# Patient Record
Sex: Male | Born: 1990 | Race: Asian | Hispanic: No | Marital: Married | Smoking: Never smoker
Health system: Southern US, Community
[De-identification: ages and names within clinical notes are randomized; demographics above are authoritative.]

---

## 2018-02-25 ENCOUNTER — Emergency Department (HOSPITAL_COMMUNITY)
Admission: EM | Admit: 2018-02-25 | Discharge: 2018-02-25 | Disposition: A | Discharge status: Home / Self Care | Payer: 59 | Attending: Emergency Medicine | Admitting: Emergency Medicine

## 2018-02-25 ENCOUNTER — Other Ambulatory Visit: Payer: Self-pay

## 2018-02-25 ENCOUNTER — Emergency Department (HOSPITAL_COMMUNITY): Payer: 59

## 2018-02-25 DIAGNOSIS — M5442 Lumbago with sciatica, left side: Secondary | ICD-10-CM

## 2018-02-25 DIAGNOSIS — M545 Low back pain: Secondary | ICD-10-CM | POA: Diagnosis present

## 2018-02-25 MED ORDER — METHOCARBAMOL 500 MG PO TABS: 500.0000 mg | ORAL_TABLET | Freq: Two times a day (BID) | ORAL | 0 refills | Status: DC

## 2018-02-25 MED ORDER — CYCLOBENZAPRINE HCL 10 MG PO TABS
10.0000 mg | ORAL_TABLET | Freq: Once | ORAL | Status: AC
  Administered 2018-02-25: 10 mg via ORAL
  Filled 2018-02-25: qty 1

## 2018-02-25 MED ORDER — ACETAMINOPHEN 500 MG PO TABS
1000.0000 mg | ORAL_TABLET | Freq: Once | ORAL | Status: AC
  Administered 2018-02-25: 1000 mg via ORAL
  Filled 2018-02-25: qty 2

## 2018-02-25 NOTE — ED Provider Notes (Signed)
Patient placed in Quick Look pathway, seen and evaluated   Chief Complaint: Back pain  HPI:   27 year old male presents with lower back pain radiating to the left leg starting approximately 2 hours ago.  He states he was at lunch and he twisted a certain way and cause acute onset of the back pain.  The pain is severe and he feels like his leg is weak because of the pain.  The pain radiates to his knee.  He has had similar episodes before but never this severe.  He does not do heavy lifting. No fever, syncope, trauma, unexplained weight loss, hx of cancer, loss of bowel/bladder function, saddle anesthesia, urinary retention, IVDU.  ROS: +back pain  -fever  Physical Exam:   Gen: No distress  Neuro: Awake and Alert  Skin: Warm    Focused Exam: Back: No masses, deformity, or rash   Palpation: No midline spinal tenderness. No paraspinal muscle tenderness.   Strength: 5/5 in lower extremities and normal plantar and dorsiflexion   Sensation: Intact sensation with light touch in lower extremities bilaterally   Gait: Antalgic gait   Plan: Pain control, lumbar xray  Initiation of care has begun. The patient has been counseled on the process, plan, and necessity for staying for the completion/evaluation, and the remainder of the medical screening examination    Bethel Born, PA-C 02/25/18 1416    Gerhard Munch, MD 02/26/18 2046

## 2018-02-25 NOTE — Discharge Instructions (Addendum)
Please read attached information. If you experience any new or worsening signs or symptoms please return to the emergency room for evaluation. Please follow-up with your primary care provider or specialist as discussed. Please use medication prescribed only as directed and discontinue taking if you have any concerning signs or symptoms.   °

## 2018-02-25 NOTE — ED Triage Notes (Signed)
Pt states for about a month has had mild symptoms of lower back pain. Today he has lower back pain with sever shooting pain into left leg.

## 2018-02-27 NOTE — ED Provider Notes (Signed)
[facility] EMERGENCY DEPARTMENT Provider Note   CSN: [id number] Arrival date & time: 02/25/18  1332     History   Chief Complaint Chief Complaint  Patient presents with  . Back Pain  . Leg Pain    HPI Satoshi Kalas is a 27 y.o. male.  HPI   27 year old male presents today with complaints of low back pain.  Patient reports he was getting out of his car when he made a movement causing excruciating left lower back pain radiating down his left leg.  Denies any loss of distal sensation strength and motor function, notes ambulation does hurt his back.,  Denies any red flags.  Denies any history of the same.   No past medical history on file.  There are no active problems to display for this patient.      Home Medications    Prior to Admission medications   Medication Sig Start Date End Date Taking? Authorizing Provider  methocarbamol (ROBAXIN) 500 MG tablet Take 1 tablet (500 mg total) by mouth 2 (two) times daily. 02/25/18   Eyvonne Mechanic, PA-C    Family History No family history on file.  Social History Social History   Tobacco Use  . Smoking status: Not on file  Substance Use Topics  . Alcohol use: Not on file  . Drug use: Not on file     Allergies   Patient has no known allergies.   Review of Systems Review of Systems  All other systems reviewed and are negative.  Physical Exam Updated Vital Signs BP (!) 129/54 (BP Location: Right Arm)   Pulse 61   Temp 98.1 F (36.7 C) (Oral)   Resp 20   SpO2 100%   Physical Exam  Constitutional: He is oriented to person, place, and time. He appears well-developed and well-nourished.  HENT:  Head: Normocephalic and atraumatic.  Eyes: Pupils are equal, round, and reactive to light. Conjunctivae are normal. Right eye exhibits no discharge. Left eye exhibits no discharge. No scleral icterus.  Neck: Normal range of motion. No JVD present. No tracheal deviation present.  Pulmonary/Chest: Effort  normal. No stridor.  Musculoskeletal:  No significant CT or L-spine tenderness palpation, no tenderness palpation of the musculature, straight leg negative distal sensation strength and motor function intact  Neurological: He is alert and oriented to person, place, and time. Coordination normal.  Psychiatric: He has a normal mood and affect. His behavior is normal. Judgment and thought content normal.  Nursing note and vitals reviewed.    ED Treatments / Results  Labs (all labs ordered are listed, but only abnormal results are displayed) Labs Reviewed - No data to display  EKG None  Radiology No results found.  Procedures Procedures (including critical care time)  Medications Ordered in ED Medications  acetaminophen (TYLENOL) tablet 1,000 mg (1,000 mg Oral Given 02/25/18 1422)  cyclobenzaprine (FLEXERIL) tablet 10 mg (10 mg Oral Given 02/25/18 1422)     Initial Impression / Assessment and Plan / ED Course  I have reviewed the triage vital signs and the nursing notes.  Pertinent labs & imaging results that were available during my care of the patient were reviewed by me and considered in my medical decision making (see chart for details).      Final Clinical Impressions(s) / ED Diagnoses   Final diagnoses:  Acute left-sided low back pain with left-sided sciatica   Labs:   Imaging: DG lumbar spine complete   Consults:  Therapeutics:  Discharge Meds:  Robaxin  Assessment/Plan: 27 year old male presents today with unconjugated back pain.  No neurological deficits, no acute findings on plain films.  Symptomatic care instructions given, strict return precautions given.  He verbalized understanding and agreement to today's plan had no further questions or concerns at time of discharge.    ED Discharge Orders        Ordered    methocarbamol (ROBAXIN) 500 MG tablet  2 times daily     02/25/18 1754       Rosalio Loud 02/27/18 1935    Wynetta Fines,  MD 03/05/18 [phone]

## 2020-02-29 ENCOUNTER — Ambulatory Visit: Payer: 59 | Attending: Internal Medicine

## 2020-02-29 DIAGNOSIS — Z20822 Contact with and (suspected) exposure to covid-19: Secondary | ICD-10-CM

## 2020-03-01 LAB — NOVEL CORONAVIRUS, NAA: SARS-CoV-2, NAA: NOT DETECTED

## 2020-03-01 LAB — SARS-COV-2, NAA 2 DAY TAT

## 2021-06-05 ENCOUNTER — Encounter (HOSPITAL_COMMUNITY): Payer: Self-pay

## 2021-06-05 ENCOUNTER — Ambulatory Visit (HOSPITAL_COMMUNITY)
Admission: EM | Admit: 2021-06-05 | Discharge: 2021-06-05 | Disposition: A | Discharge status: Home / Self Care | Payer: 59 | Attending: Physician Assistant | Admitting: Physician Assistant

## 2021-06-05 ENCOUNTER — Other Ambulatory Visit: Payer: Self-pay

## 2021-06-05 ENCOUNTER — Ambulatory Visit (INDEPENDENT_AMBULATORY_CARE_PROVIDER_SITE_OTHER): Payer: 59

## 2021-06-05 DIAGNOSIS — M25572 Pain in left ankle and joints of left foot: Secondary | ICD-10-CM

## 2021-06-05 MED ORDER — PREDNISONE 20 MG PO TABS: 40.0000 mg | ORAL_TABLET | Freq: Every day | ORAL | 0 refills | Status: AC

## 2021-06-05 NOTE — ED Provider Notes (Signed)
[facility]    CSN: [id number] Arrival date & time: 06/05/21  1244      History   Chief Complaint Chief Complaint  Patient presents with   Ankle Pain    HPI Walter Lozano is a 30 y.o. male.   Patient presents today with a 2-day history of worsening left medial ankle pain.  Reports that prior to symptom onset he did go hiking but denies any specific injury prior to symptom onset.  He reports pain is gradually been worsening and is currently rated 4 at rest but increases to 10 with attempted ambulation or movement of tenderness, described as sharp, no leaving factors identified.  He has not tried any over-the-counter medications for symptom management but has been using a brace which was provided no relief of symptoms.  He does report some numbness due to swelling but denies any paresthesias or inability to bear weight.  Denies previous injury or surgery involving ankle.  Denies any recent change to footwear or shoes prior to symptom onset.   History reviewed. No pertinent past medical history.  There are no problems to display for this patient.   History reviewed. No pertinent surgical history.     Home Medications    Prior to Admission medications   Medication Sig Start Date End Date Taking? Authorizing Provider  predniSONE (DELTASONE) 20 MG tablet Take 2 tablets (40 mg total) by mouth daily for 5 days. 06/05/21 06/10/21 Yes Wylodean Shimmel, Noberto Retort, PA-C  methocarbamol (ROBAXIN) 500 MG tablet Take 1 tablet (500 mg total) by mouth 2 (two) times daily. 02/25/18   Eyvonne Mechanic, PA-C    Family History History reviewed. No pertinent family history.  Social History Social History   Tobacco Use   Smoking status: Never   Smokeless tobacco: Never  Vaping Use   Vaping Use: Never used  Substance Use Topics   Drug use: Never     Allergies   Patient has no known allergies.   Review of Systems Review of Systems  Constitutional:  Positive for activity change. Negative for  appetite change, fatigue and fever.  Respiratory:  Negative for cough and shortness of breath.   Cardiovascular:  Negative for chest pain.  Gastrointestinal:  Negative for abdominal pain, diarrhea, nausea and vomiting.  Musculoskeletal:  Positive for arthralgias, gait problem and joint swelling. Negative for myalgias.  Neurological:  Negative for dizziness, light-headedness and headaches.    Physical Exam Triage Vital Signs ED Triage Vitals  Enc Vitals Group     BP 06/05/21 1335 123/86     Pulse Rate 06/05/21 1335 81     Resp 06/05/21 1335 18     Temp 06/05/21 1334 98 F (36.7 C)     Temp Source 06/05/21 1334 Oral     SpO2 06/05/21 1335 98 %     Weight --      Height --      Head Circumference --      Peak Flow --      Pain Score 06/05/21 1333 10     Pain Loc --      Pain Edu? --      Excl. in GC? --    No data found.  Updated Vital Signs BP 123/86 (BP Location: Right Arm)   Pulse 81   Temp 98 F (36.7 C) (Oral)   Resp 18   SpO2 98%   Visual Acuity Right Eye Distance:   Left Eye Distance:   Bilateral Distance:    Right  Eye Near:   Left Eye Near:    Bilateral Near:     Physical Exam Vitals reviewed.  Constitutional:      General: He is awake.     Appearance: Normal appearance. He is normal weight. He is not ill-appearing.     Comments: Very pleasant male appears stated age no acute distress sitting comfortably in wheelchair  HENT:     Head: Normocephalic and atraumatic.  Cardiovascular:     Rate and Rhythm: Normal rate and regular rhythm.     Pulses:          Posterior tibial pulses are 2+ on the right side and 2+ on the left side.     Heart sounds: Normal heart sounds, S1 normal and S2 normal. No murmur heard.    Comments: Capillary refill within 2 seconds bilateral toes Pulmonary:     Effort: Pulmonary effort is normal.     Breath sounds: Normal breath sounds. No stridor. No wheezing, rhonchi or rales.     Comments: Clear to auscultation  bilaterally Musculoskeletal:     Left ankle: No swelling, deformity or ecchymosis. Tenderness present over the medial malleolus. Decreased range of motion.     Left Achilles Tendon: No tenderness. Thompson's test negative.     Comments: Left ankle/foot: Decreased range of motion secondary to pain with dorsiflexion, plantarflexion, inversion, eversion.  Tenderness palpation over medial malleolus.  No deformity noted.  No tenderness palpation over Achilles tendon.  Foot neurovascularly intact.  Neurological:     Mental Status: He is alert.  Psychiatric:        Behavior: Behavior is cooperative.     UC Treatments / Results  Labs (all labs ordered are listed, but only abnormal results are displayed) Labs Reviewed - No data to display  EKG   Radiology DG Ankle Complete Left  Result Date: 06/05/2021 CLINICAL DATA:  Pain at the medial malleolus. EXAM: LEFT ANKLE COMPLETE - 3+ VIEW COMPARISON:  None. FINDINGS: There is no evidence of fracture, dislocation, or joint effusion. The ankle mortise is intact. There is no evidence of arthropathy or other focal bone abnormality. Soft tissues are unremarkable. IMPRESSION: Negative. Electronically Signed   By: Wiliam Ke M.D.   On: 06/05/2021 14:28    Procedures Procedures (including critical care time)  Medications Ordered in UC Medications - No data to display  Initial Impression / Assessment and Plan / UC Course  I have reviewed the triage vital signs and the nursing notes.  Pertinent labs & imaging results that were available during my care of the patient were reviewed by me and considered in my medical decision making (see chart for details).      X-ray obtained given tenderness over medial malleolus showed no acute osseous findings.  Patient already has an ankle brace and will continue using this regularly.  Recommended conservative treatment options including RICE protocol.  He was prescribed prednisone burst (40 mg x 5 days) with  instruction not to take NSAIDs with this medication due to risk of bleeding.  Can take Tylenol for breakthrough pain.  He was given contact information for local sports medicine provider and instructed to follow-up with them if symptoms or not improving within a few days with conservative treatment.  Discussed alarm symptoms that warrant emergent evaluation.  Strict return precautions given to which patient expressed understanding.  Final Clinical Impressions(s) / UC Diagnoses   Final diagnoses:  Acute left ankle pain     Discharge Instructions  Your x-ray was normal with no evidence of fracture.  We are going to use prednisone to help with pain and inflammation.  Take 2 tablets (40 mg) in the morning for 5 days.  Do not take NSAIDs including aspirin, ibuprofen/Advil, naproxen/Aleve with this medication as it can cause stomach bleeding.  You can take Tylenol for breakthrough pain.  Continue with RICE protocol as we discussed including rest, ice, compression, elevation.  Continue using your brace for comfort and compression.  If your symptoms or not improving please follow-up with sports medicine as we discussed.     ED Prescriptions     Medication Sig Dispense Auth. Provider   predniSONE (DELTASONE) 20 MG tablet Take 2 tablets (40 mg total) by mouth daily for 5 days. 10 tablet Jahleah Mariscal, Noberto Retort, PA-C      PDMP not reviewed this encounter.   Jeani Hawking, PA-C 06/05/21 1442

## 2021-06-05 NOTE — Discharge Instructions (Addendum)
Your x-ray was normal with no evidence of fracture.  We are going to use prednisone to help with pain and inflammation.  Take 2 tablets (40 mg) in the morning for 5 days.  Do not take NSAIDs including aspirin, ibuprofen/Advil, naproxen/Aleve with this medication as it can cause stomach bleeding.  You can take Tylenol for breakthrough pain.  Continue with RICE protocol as we discussed including rest, ice, compression, elevation.  Continue using your brace for comfort and compression.  If your symptoms or not improving please follow-up with sports medicine as we discussed.

## 2021-06-05 NOTE — ED Triage Notes (Signed)
Pt In with c/o left ankle pain after hiking 2 days ago  Denies any fall or injury  Pt is wearing brace and has some swelling

## 2021-10-04 ENCOUNTER — Other Ambulatory Visit: Payer: Self-pay

## 2021-10-04 ENCOUNTER — Encounter (HOSPITAL_COMMUNITY): Payer: Self-pay | Admitting: Emergency Medicine

## 2021-10-04 ENCOUNTER — Ambulatory Visit (HOSPITAL_COMMUNITY)
Admission: EM | Admit: 2021-10-04 | Discharge: 2021-10-04 | Disposition: A | Discharge status: Home / Self Care | Payer: 59 | Attending: Family Medicine | Admitting: Family Medicine

## 2021-10-04 DIAGNOSIS — U071 COVID-19: Secondary | ICD-10-CM

## 2021-10-04 MED ORDER — PROMETHAZINE-DM 6.25-15 MG/5ML PO SYRP: 5.0000 mL | ORAL_SOLUTION | Freq: Four times a day (QID) | ORAL | 0 refills | Status: AC | PRN

## 2021-10-04 NOTE — ED Triage Notes (Addendum)
Patient c/o headache, nasal congestion, and nonproductive cough x 2 days.   Patient endorses sore throat and generalized body aches.   Patient endorses having a positive at home COVID test (first positive test was on Monday).   Patient need note for work.   Patient has taken ibuprofen with relief of symptoms.

## 2021-10-05 NOTE — ED Provider Notes (Signed)
°  [facility]   [id number] 10/04/21 Arrival Time: 1756  ASSESSMENT & PLAN:  1. COVID-19 virus infection    Work note provided. OTC symptom care as needed.  Discharge Medication List as of 10/04/2021  7:33 PM     START taking these medications   Details  promethazine-dextromethorphan (PROMETHAZINE-DM) 6.25-15 MG/5ML syrup Take 5 mLs by mouth 4 (four) times daily as needed for cough., Starting Wed 10/04/2021, Normal         Follow-up Information      Urgent Care at [facility].   Specialty: Urgent Care Why: If worsening or failing to improve as anticipated. Contact information: [address] [place] [address] [phone]                Reviewed expectations re: course of current medical issues. Questions answered. Outlined signs and symptoms indicating need for more acute intervention. Understanding verbalized. After Visit Summary given.   SUBJECTIVE: History from: patient. Walter Lozano is a 30 y.o. male who reports: HA, nasal cong, non-prod cough; x 2 days. Home COVID test positive. Denies: difficulty breathing. Normal PO intake without n/v/d.  OBJECTIVE:  Vitals:   10/04/21 1924  BP: 120/70  Pulse: (!) 110  Resp: 16  Temp: 99.5 F (37.5 C)  TempSrc: Oral  SpO2: 97%    General appearance: alert; no distress Eyes: PERRLA; EOMI; conjunctiva normal HENT: [place]; AT; with nasal congestion Neck: supple  Lungs: speaks full sentences without difficulty; unlabored Extremities: no edema Skin: warm and dry Neurologic: normal gait Psychological: alert and cooperative; normal mood and affect   No Known Allergies  History reviewed. No pertinent past medical history. Social History   Socioeconomic History   Marital status: Married    Spouse name: Not on file   Number of children: Not on file   Years of education: Not on file   Highest education level: Not on file  Occupational History   Not on file  Tobacco  Use   Smoking status: Never   Smokeless tobacco: Never  Vaping Use   Vaping Use: Never used  Substance and Sexual Activity   Alcohol use: Not on file   Drug use: Never   Sexual activity: Not on file  Other Topics Concern   Not on file  Social History Narrative   Not on file   Social Determinants of Health   Financial Resource Strain: Not on file  Food Insecurity: Not on file  Transportation Needs: Not on file  Physical Activity: Not on file  Stress: Not on file  Social Connections: Not on file  Intimate Partner Violence: Not on file   History reviewed. No pertinent family history. History reviewed. No pertinent surgical history.   Mardella Layman, MD 10/05/21 [phone]

## 2023-01-19 IMAGING — DX DG ANKLE COMPLETE 3+V*L*
3 series · 3 of 3 positions shown · non-contrast
Comparison: None.

CLINICAL DATA: Pain at the medial malleolus.

EXAM:
LEFT ANKLE COMPLETE - 3+ VIEW

[ankle ap]
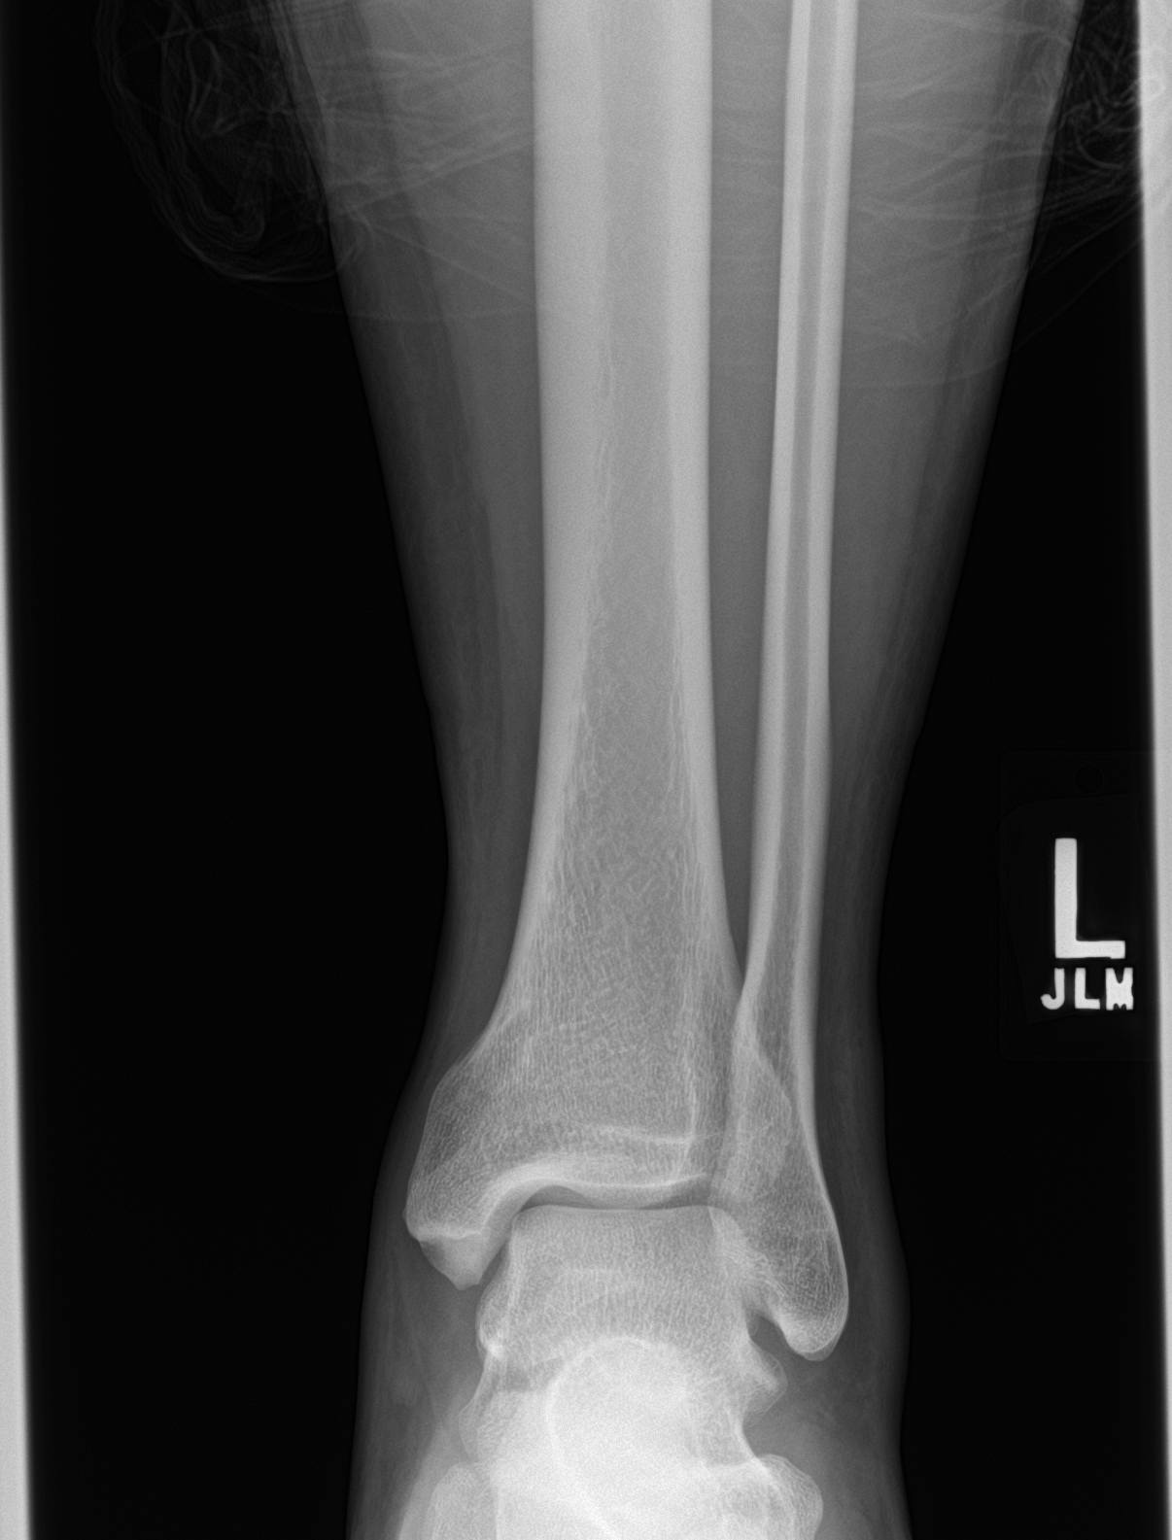

[ankle obl]
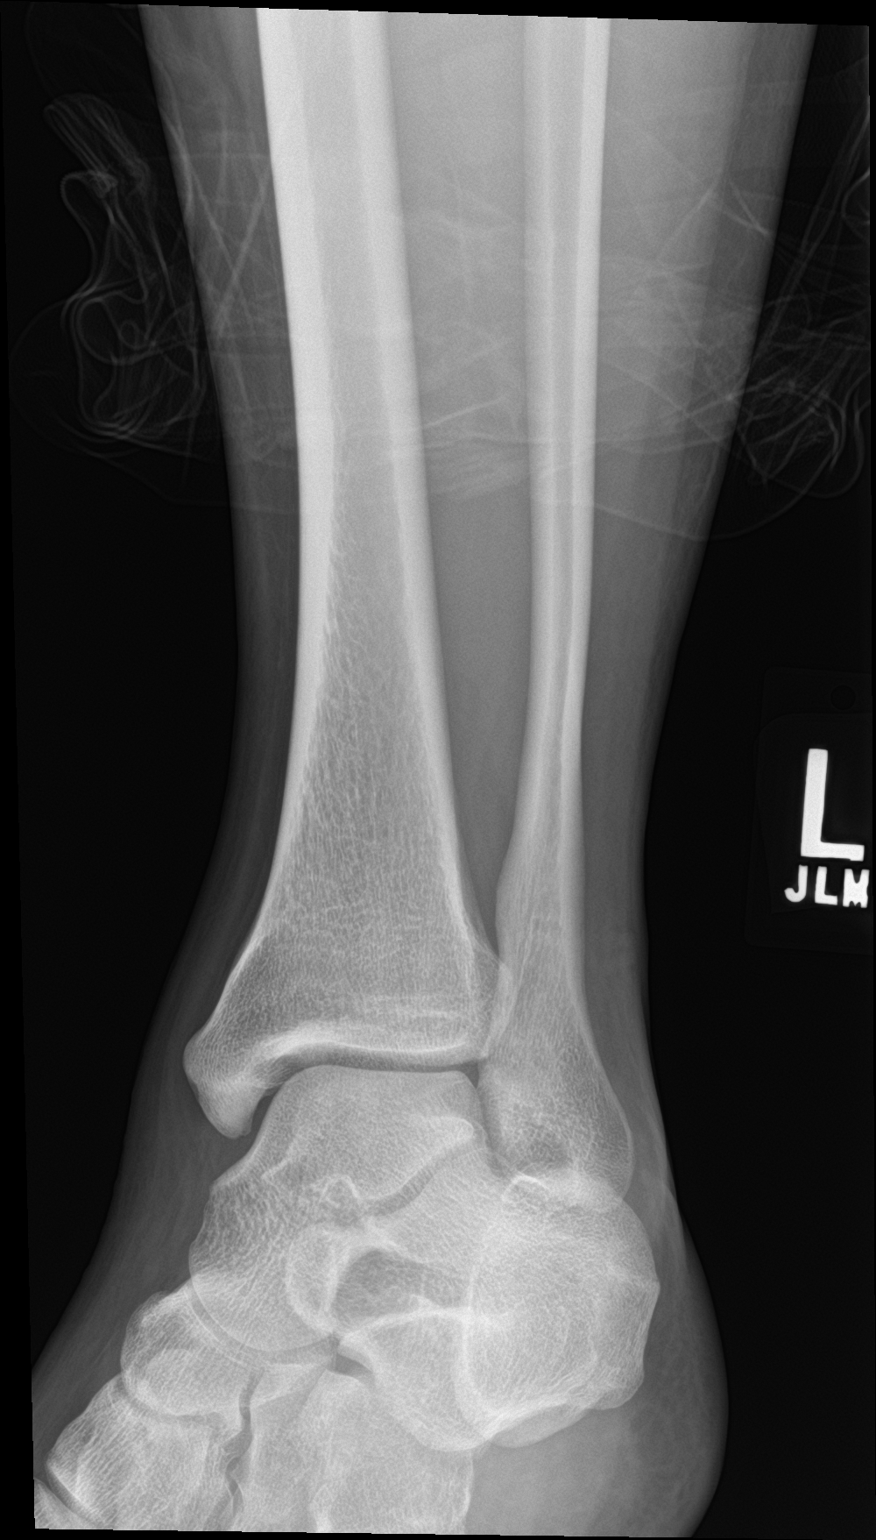

[ankle lat]
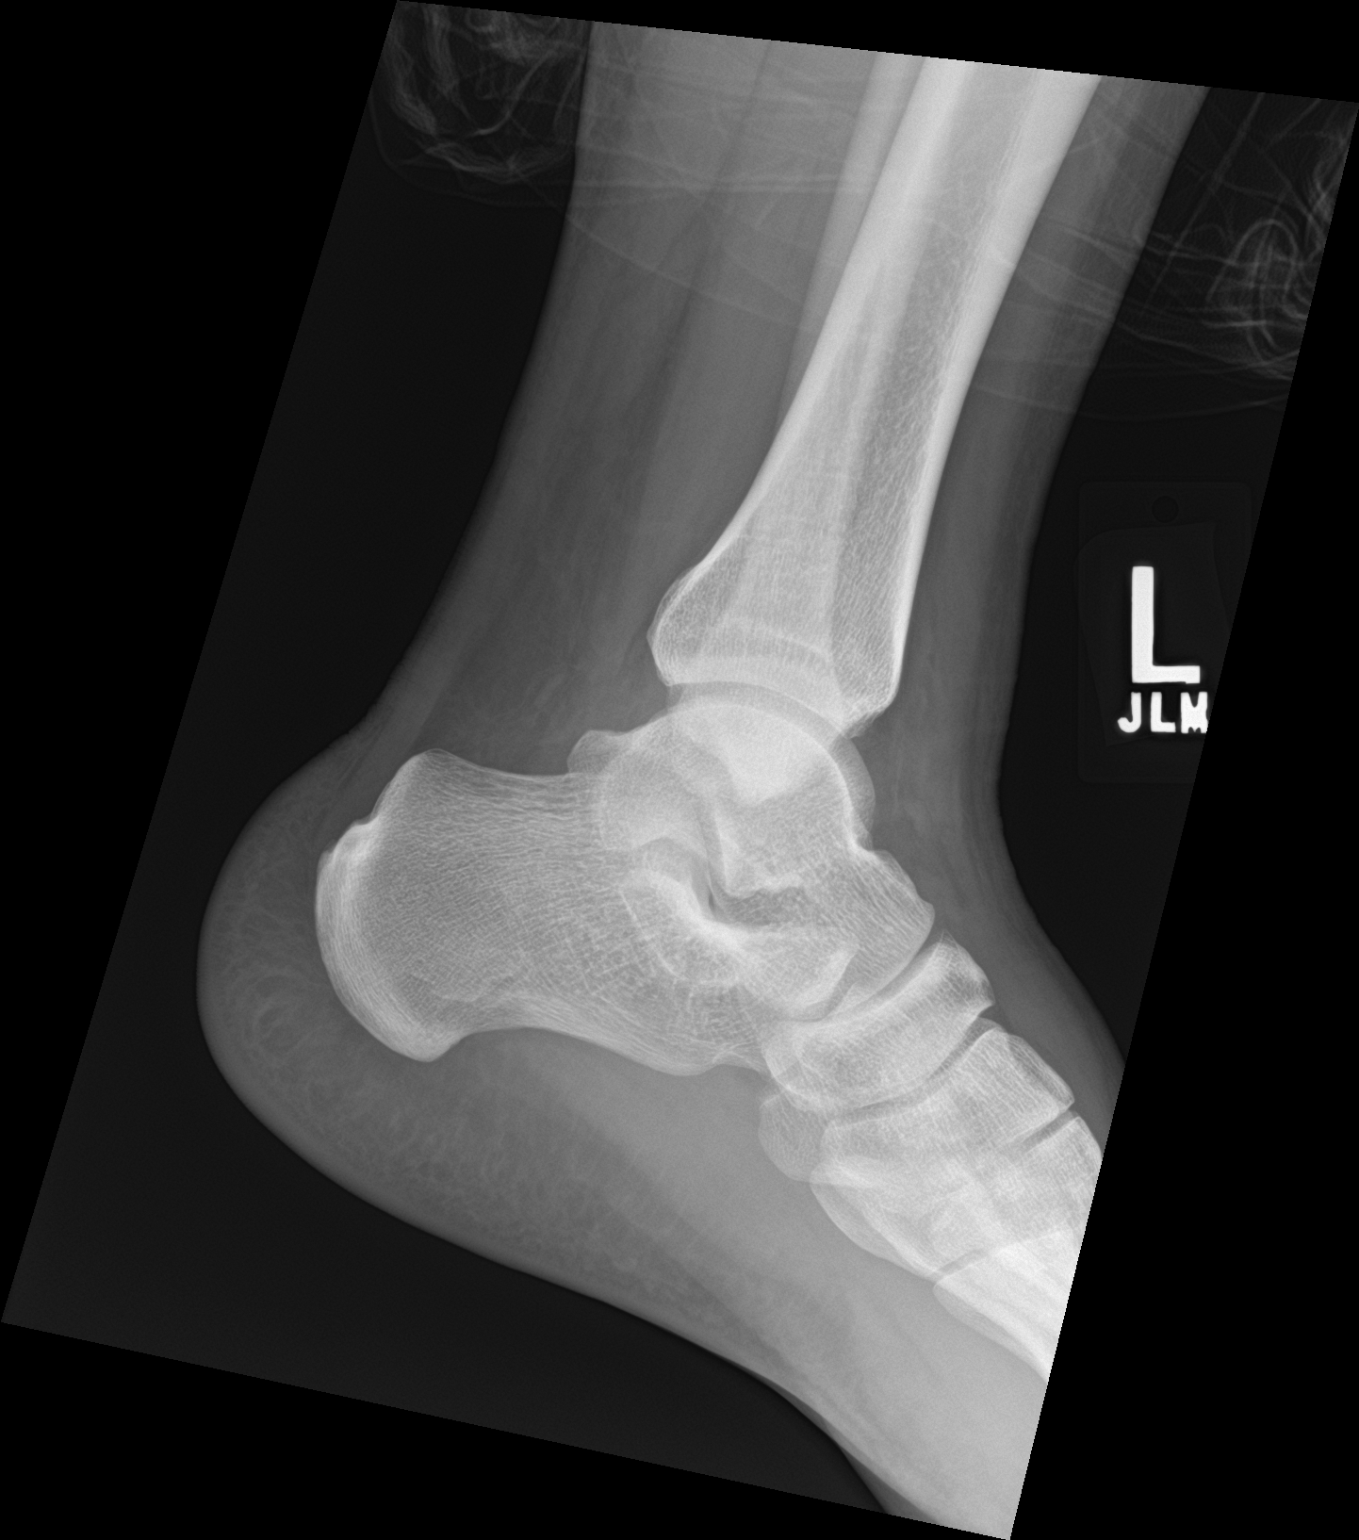

[3 of 3 positions shown; findings below may reference images not displayed]

FINDINGS: There is no evidence of fracture, dislocation, or joint effusion.
The ankle mortise is intact. There is no evidence of arthropathy or
other focal bone abnormality. Soft tissues are unremarkable.
IMPRESSION: Negative.
